# Patient Record
Sex: Female | Born: 2005 | Hispanic: No | Marital: Single | Smoking: Never smoker
Health system: Southern US, Community
[De-identification: ages and names within clinical notes are randomized; demographics above are authoritative.]

## PROBLEM LIST (undated history)

## (undated) DIAGNOSIS — Z9289 Personal history of other medical treatment: Secondary | ICD-10-CM

## (undated) DIAGNOSIS — Z969 Presence of functional implant, unspecified: Secondary | ICD-10-CM

---
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
## 2024-01-02 ENCOUNTER — Ambulatory Visit (INDEPENDENT_AMBULATORY_CARE_PROVIDER_SITE_OTHER): Admitting: Podiatry

## 2024-01-02 DIAGNOSIS — D492 Neoplasm of unspecified behavior of bone, soft tissue, and skin: Secondary | ICD-10-CM | POA: Diagnosis not present

## 2024-01-02 NOTE — Patient Instructions (Signed)
 Normal activity can resumed after the procedure/treatment. Covering the area with a bandaid is acceptable if you would feel more comfortable.  Sun Exposure after lasers: Strict Sun Avoidance (Initially): Avoid direct sun exposure to your feet for at least one to two weeks following the treatment. Use Sunscreen: When outdoors, apply a broad-spectrum sunscreen with an SPF of 30 or higher to your feet, even on cloudy days. Wear Protective Clothing: Cover your feet with socks or shoes whenever possible, especially during peak sun hours.

## 2024-01-02 NOTE — Progress Notes (Signed)
 Patient presents today for laser treatment of plantar warts on the left foot. There are 5 lesions.  There are 2 lesions located on the plantar aspect of the left hallux, 1 on the left fourth toe, and 2 lesions left submet 5.  Dr. Dolan patient.  All other systems are negative.  Lesions were debrided superficially by Dr. Loel using a sterile #313 blade. Laser therapy was administered to the left foot by RN to all the lesions. The patient tolerated the treatment well. All safety precautions were in place.   Follow up in 2 weeks for laser # 2.

## 2024-01-16 ENCOUNTER — Ambulatory Visit: Admitting: Podiatry

## 2024-01-16 DIAGNOSIS — D492 Neoplasm of unspecified behavior of bone, soft tissue, and skin: Secondary | ICD-10-CM | POA: Diagnosis not present

## 2024-01-16 NOTE — Progress Notes (Signed)
 Patient presents today for the 2nd laser treatment of plantar warts on the left foot. There are now 5 lesions.  3 of the 5 lesions are new since last visit.  There are 2 lesions located on the plantar aspect of the left hallux, 1 on the left fourth toe, 1 lesion on the tip of the 5th toe and 2 lesions left submet 5.   Dr. Dolan patient.   All other systems are negative.   Lesions were debrided superficially by Dr. Loel using a sterile #313 blade. Laser therapy was administered to the left foot by RN to all the lesions. The patient tolerated the treatment well. All safety precautions were in place.   Follow-up in 2 weeks for laser wart treatment #3

## 2024-01-19 ENCOUNTER — Other Ambulatory Visit

## 2024-01-30 ENCOUNTER — Ambulatory Visit (INDEPENDENT_AMBULATORY_CARE_PROVIDER_SITE_OTHER): Admitting: Podiatry

## 2024-01-30 DIAGNOSIS — D492 Neoplasm of unspecified behavior of bone, soft tissue, and skin: Secondary | ICD-10-CM | POA: Diagnosis not present

## 2024-01-30 NOTE — Progress Notes (Signed)
 Patient presents today for the 3rd laser treatment of plantar warts on the left foot.  The 5 lesions are still present.  3 of the 5 lesions are new since last visit.  There are 2 lesions located on the plantar aspect of the left hallux, 1 on the left fourth toe, 1 lesion on the distal tip of the 5th toe and 2 lesions left submet 5.  Patient denies noticeable improvement in appearance.      Dr. Dolan patient.   All other systems are negative.   Lesions were treated with the PowerPoint laser therapy to the left foot skin neoplasms by [email or web address]   Also, per provider instructions:Cantharone applied to lesions post laser treatment. The patient tolerated the treatment well. All safety precautions were in place.

## 2024-02-19 ENCOUNTER — Ambulatory Visit (INDEPENDENT_AMBULATORY_CARE_PROVIDER_SITE_OTHER): Admitting: Podiatry

## 2024-02-19 ENCOUNTER — Encounter: Payer: Self-pay | Admitting: Podiatry

## 2024-02-19 DIAGNOSIS — D492 Neoplasm of unspecified behavior of bone, soft tissue, and skin: Secondary | ICD-10-CM | POA: Diagnosis not present

## 2024-02-19 NOTE — Progress Notes (Signed)
  Chief Complaint  Patient presents with   Plantar Warts     F/U lesion on the distal tip of the 5th toe and 2 lesions left submet 5. Catheridine treatment. 0 pain. Non diabetic.   HPI: 18 y.o. 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  Past Medical History:  Diagnosis Date   History of blood transfusion 11/2014   Retained orthopedic hardware 08/2015   bilateral femur   Past Surgical History:  Procedure Laterality Date   HARDWARE REMOVAL Right 12/23/2014   Procedure: HARDWARE REMOVAL RIGHT FEMUR;  Surgeon: Evalene JONETTA Chancy, MD;  Location: [facility];  Service: Orthopedics;  Laterality: Right;   HARDWARE REMOVAL Bilateral 09/03/2015   Procedure: BILATERAL FEMUR HARDWARE REMOVAL;  Surgeon: Evalene JONETTA Chancy, MD;  Location: [facility];  Service: Orthopedics;  Laterality: Bilateral;   MRI  03/06/2015   with Propofol    ORIF FEMUR FRACTURE Right 12/23/2014   Procedure: OPEN REDUCTION INTERNAL FIXATION RIGHT FEMORAL SHAFT FRACTURE;  Surgeon: Evalene JONETTA Chancy, MD;  Location: [facility];  Service: Orthopedics;  Laterality: Right;   ORIF FEMUR FRACTURE Bilateral 12/01/2014   Procedure: OPEN REDUCTION INTERNAL FIXATION BILATERAL FEMUR FRACTURE;  Surgeon: Evalene JONETTA Chancy, MD;  Location: [facility];  Service: Orthopedics;  Laterality: Bilateral;   Allergies  Allergen Reactions   Pentobarbital  Sodium Other (See Comments)    AGITATION    Physical Exam: Palpable pedal pulses noted.  No erythema is noted surrounding the neoplasms.  There are skin neoplasms with pain on palpation located on the plantar aspect of the left hallux x 2, the plantar pulp of the left fourth toe, the distal pulp of the left fifth toe near the eponychium, and submet 5 x 2 lesions in this area.  Assessment/Plan of Care: 1. Skin  neoplasm     Treatment included shaving of the painful skin neoplasms with a sterile #313 blade.  Cantharone solution x 3 applications was applied to the lesion post-shaving, followed by Band-Aid occlusion.  Patient will remove this in 4 to 6 hours and may expect blistering to the area in 24 to 48 hours.  Follow-up in 3 to 4 weeks for recheck.   Awanda CHARM Imperial, DPM, FACFAS [facility]     [address] [place], [place] [address]                Office [phone]  Fax [phone]

## 2024-03-11 ENCOUNTER — Encounter: Payer: Self-pay | Admitting: Podiatry

## 2024-03-11 ENCOUNTER — Ambulatory Visit: Admitting: Podiatry

## 2024-03-11 DIAGNOSIS — D492 Neoplasm of unspecified behavior of bone, soft tissue, and skin: Secondary | ICD-10-CM | POA: Diagnosis not present

## 2024-03-11 NOTE — Progress Notes (Signed)
"   °  Chief Complaint  Patient presents with   Plantar Warts    F/U wart treatments left foot. 0 pain at the present.    HPI: 18 y.o. 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  She did experience some blistering after the last treatment but overall is doing pretty well.  Past Medical History:  Diagnosis Date   History of blood transfusion 11/2014   Retained orthopedic hardware 08/2015   bilateral femur   Past Surgical History:  Procedure Laterality Date   HARDWARE REMOVAL Right 12/23/2014   Procedure: HARDWARE REMOVAL RIGHT FEMUR;  Surgeon: Evalene JONETTA Chancy, MD;  Location: [facility];  Service: Orthopedics;  Laterality: Right;   HARDWARE REMOVAL Bilateral 09/03/2015   Procedure: BILATERAL FEMUR HARDWARE REMOVAL;  Surgeon: Evalene JONETTA Chancy, MD;  Location: [facility];  Service: Orthopedics;  Laterality: Bilateral;   MRI  03/06/2015   with Propofol    ORIF FEMUR FRACTURE Right 12/23/2014   Procedure: OPEN REDUCTION INTERNAL FIXATION RIGHT FEMORAL SHAFT FRACTURE;  Surgeon: Evalene JONETTA Chancy, MD;  Location: [facility];  Service: Orthopedics;  Laterality: Right;   ORIF FEMUR FRACTURE Bilateral 12/01/2014   Procedure: OPEN REDUCTION INTERNAL FIXATION BILATERAL FEMUR FRACTURE;  Surgeon: Evalene JONETTA Chancy, MD;  Location: [facility];  Service: Orthopedics;  Laterality: Bilateral;   Allergies  Allergen Reactions   Pentobarbital  Sodium Other (See Comments)    AGITATION    Physical Exam: Palpable pedal pulses noted.  No erythema is noted surrounding the neoplasms.  There are skin neoplasms with pain on palpation located on the plantar aspect of the left hallux x 2, the plantar pulp of the left fourth toe, the distal pulp of the left fifth toe near the eponychium, and submet 5 x 2 lesions in this area.  These are  at least 80% better since we originally began treating her lesions/neoplasms.  Assessment/Plan of Care: 1. Skin neoplasm     Treatment included shaving of the painful skin neoplasms with a sterile #313 blade.  Cantharone solution x 2 applications was applied to the lesion post-shaving, followed by Band-Aid occlusion.  Patient will remove this in 4 to 6 hours and may expect blistering to the area in 24 to 48 hours.  Follow-up in 3 to 4 weeks for recheck.   Awanda CHARM Imperial, DPM, FACFAS [facility]     [address] [place], [place] [address]                Office [phone]  Fax [phone]  "

## 2024-04-08 ENCOUNTER — Ambulatory Visit: Admitting: Podiatry

## 2024-04-15 ENCOUNTER — Ambulatory Visit: Admitting: Podiatry

## 2024-04-19 ENCOUNTER — Ambulatory Visit: Admitting: Podiatry

## 2024-04-19 ENCOUNTER — Encounter: Payer: Self-pay | Admitting: Podiatry

## 2024-04-19 DIAGNOSIS — D492 Neoplasm of unspecified behavior of bone, soft tissue, and skin: Secondary | ICD-10-CM

## 2024-04-19 MED ORDER — FLUOROURACIL 5 % EX CREA: TOPICAL_CREAM | Freq: Two times a day (BID) | CUTANEOUS | 0 refills | Status: AC

## 2024-04-19 NOTE — Progress Notes (Unsigned)
 Left sub 5th x2, distal left 5th toe just distal to nail Plantar distal left 4th toe 1st toe distal to plantar mpj  3 canthrarone tx including today  Starting effudex

## 2024-04-19 NOTE — Patient Instructions (Signed)
 Take dressing off in 8 hours and wash the foot with soap and water. If it is hurting or becomes uncomfortable before the 8 hours, go ahead and remove the bandage and wash the area.  If it blisters, apply antibiotic ointment and a band-aid.  Monitor for any signs/symptoms of infection. Call the office immediately if any occur or go directly to the emergency room. Call with any questions/concerns.

## 2024-05-17 ENCOUNTER — Ambulatory Visit: Admitting: Podiatry
